# Patient Record
Sex: Male | Born: 1980 | Race: White | Hispanic: No | Smoking: Never smoker
Health system: Southern US, Community
[De-identification: ages and names within clinical notes are randomized; demographics above are authoritative.]

## PROBLEM LIST (undated history)

## (undated) DIAGNOSIS — M109 Gout, unspecified: Secondary | ICD-10-CM

---

## 2021-07-03 ENCOUNTER — Other Ambulatory Visit: Payer: Self-pay

## 2021-07-03 ENCOUNTER — Emergency Department (HOSPITAL_BASED_OUTPATIENT_CLINIC_OR_DEPARTMENT_OTHER)
Admission: EM | Admit: 2021-07-03 | Discharge: 2021-07-03 | Disposition: A | Discharge status: Home / Self Care | Payer: 59 | Attending: Emergency Medicine | Admitting: Emergency Medicine

## 2021-07-03 ENCOUNTER — Emergency Department (HOSPITAL_BASED_OUTPATIENT_CLINIC_OR_DEPARTMENT_OTHER): Payer: 59

## 2021-07-03 ENCOUNTER — Encounter (HOSPITAL_BASED_OUTPATIENT_CLINIC_OR_DEPARTMENT_OTHER): Payer: Self-pay | Admitting: *Deleted

## 2021-07-03 DIAGNOSIS — K921 Melena: Secondary | ICD-10-CM

## 2021-07-03 DIAGNOSIS — R1084 Generalized abdominal pain: Secondary | ICD-10-CM | POA: Diagnosis present

## 2021-07-03 DIAGNOSIS — R197 Diarrhea, unspecified: Secondary | ICD-10-CM

## 2021-07-03 HISTORY — DX: Gout, unspecified: M10.9

## 2021-07-03 LAB — CBC WITH DIFFERENTIAL/PLATELET
Abs Immature Granulocytes: 0.03 10*3/uL (ref 0.00–0.07)
Basophils Absolute: 0 10*3/uL (ref 0.0–0.1)
Basophils Relative: 0 %
Eosinophils Absolute: 0 10*3/uL (ref 0.0–0.5)
Eosinophils Relative: 0 %
HCT: 43.3 % (ref 39.0–52.0)
Hemoglobin: 15 g/dL (ref 13.0–17.0)
Immature Granulocytes: 0 %
Lymphocytes Relative: 18 %
Lymphs Abs: 1.6 10*3/uL (ref 0.7–4.0)
MCH: 28.8 pg (ref 26.0–34.0)
MCHC: 34.6 g/dL (ref 30.0–36.0)
MCV: 83.3 fL (ref 80.0–100.0)
Monocytes Absolute: 0.4 10*3/uL (ref 0.1–1.0)
Monocytes Relative: 5 %
Neutro Abs: 6.6 10*3/uL (ref 1.7–7.7)
Neutrophils Relative %: 77 %
Platelets: 230 10*3/uL (ref 150–400)
RBC: 5.2 MIL/uL (ref 4.22–5.81)
RDW: 12.7 % (ref 11.5–15.5)
WBC: 8.7 10*3/uL (ref 4.0–10.5)
nRBC: 0 % (ref 0.0–0.2)

## 2021-07-03 LAB — COMPREHENSIVE METABOLIC PANEL
ALT: 61 U/L — ABNORMAL HIGH (ref 0–44)
AST: 41 U/L (ref 15–41)
Albumin: 4.5 g/dL (ref 3.5–5.0)
Alkaline Phosphatase: 57 U/L (ref 38–126)
Anion gap: 8 (ref 5–15)
BUN: 12 mg/dL (ref 6–20)
CO2: 23 mmol/L (ref 22–32)
Calcium: 9.1 mg/dL (ref 8.9–10.3)
Chloride: 104 mmol/L (ref 98–111)
Creatinine, Ser: 0.77 mg/dL (ref 0.61–1.24)
GFR, Estimated: >60 mL/min (ref >60)
Glucose, Bld: 103 mg/dL — ABNORMAL HIGH (ref 70–99)
Potassium: 4.1 mmol/L (ref 3.5–5.1)
Sodium: 135 mmol/L (ref 135–145)
Total Bilirubin: 1.7 mg/dL — ABNORMAL HIGH (ref 0.3–1.2)
Total Protein: 7.6 g/dL (ref 6.5–8.1)

## 2021-07-03 LAB — URINALYSIS, ROUTINE W REFLEX MICROSCOPIC
Bilirubin Urine: NEGATIVE
Glucose, UA: NEGATIVE mg/dL
Hgb urine dipstick: NEGATIVE
Ketones, ur: NEGATIVE mg/dL
Leukocytes,Ua: NEGATIVE
Nitrite: NEGATIVE
Protein, ur: NEGATIVE mg/dL
Specific Gravity, Urine: 1.015 (ref 1.005–1.030)
pH: 7 (ref 5.0–8.0)

## 2021-07-03 LAB — LIPASE, BLOOD: Lipase: 30 U/L (ref 11–51)

## 2021-07-03 LAB — OCCULT BLOOD X 1 CARD TO LAB, STOOL: Fecal Occult Bld: NEGATIVE

## 2021-07-03 MED ORDER — DICYCLOMINE HCL 20 MG PO TABS: 20.0000 mg | ORAL_TABLET | Freq: Two times a day (BID) | ORAL | 0 refills | Status: AC

## 2021-07-03 MED ORDER — IOHEXOL 300 MG/ML  SOLN
100.0000 mL | Freq: Once | INTRAMUSCULAR | Status: AC | PRN
  Administered 2021-07-03: 100 mL via INTRAVENOUS

## 2021-07-03 NOTE — ED Notes (Signed)
Skin warm and dry, capillary refill WNL, strong pulses noted ?

## 2021-07-03 NOTE — ED Notes (Signed)
Pt aware of need for urine specimen, unable to provide at this time, provided urinal. ?

## 2021-07-03 NOTE — Discharge Instructions (Addendum)
You were seen today for concerns of a bloody diarrhea.  The CT scan showed no acute findings.  There were no signs of infection in your lab work.  Recommend follow-up with primary care if you continue to have symptoms.  I have provided a prescription to help with spasms. If stool sample collected, you will be contacted about antibiotics if your stool sample provides a positive result. ?

## 2021-07-03 NOTE — ED Provider Notes (Signed)
?[facility] EMERGENCY DEPARTMENT ?Provider Note ? ? ?CSN: [id number] ?Arrival date & time: 07/03/21  0911 ? ?  ? ?History ? ?Chief Complaint  ?Patient presents with  ? Abdominal Pain  ? ? ?James Mckay is a 41 y.o. male.  Patient presents with diffuse abdominal pain, diarrhea, and blood in stool that began at 1 AM this morning.  Patient states he woke up with cramping and went to the bathroom having 1 solid stool followed by multiple episodes of diarrhea.  The patient had bright red blood in the toilet after multiple bowel movements.  Patient denies recent travel, denies eating any high risk foods.  Patient denies nausea, denies vomiting, denies shortness of breath, denies chest pain.  Patient also denies known fevers.  Past medical history significant for gout, obesity, patient started [facility] on June 18, 2021 ? ?HPI ? ?  ? ?Home Medications ?Prior to Admission medications   ?Medication Sig Start Date End Date Taking? Authorizing Provider  ?allopurinol (ZYLOPRIM) 100 MG tablet Take 100 mg by mouth daily.   Yes [provider]  ?dicyclomine (BENTYL) 20 MG tablet Take 1 tablet (20 mg total) by mouth 2 (two) times daily. 07/03/21  Yes Darrick Grinder, PA-C  ?   ? ?Allergies    ?Sulfa antibiotics   ? ?Review of Systems   ?Review of Systems  ?Constitutional:  Negative for fever.  ?Respiratory:  Negative for shortness of breath.   ?Cardiovascular:  Negative for chest pain.  ?Gastrointestinal:  Positive for abdominal pain, blood in stool and diarrhea.  ?Genitourinary:  Negative for dysuria and flank pain.  ? ?Physical Exam ?Updated Vital Signs ?BP 138/90   Pulse 65   Temp 98 ?F (36.7 ?C) (Oral)   Resp 18   Ht 6' (1.829 m)   Wt 124.7 kg   SpO2 98%   BMI 37.30 kg/m?  ?Physical Exam ?Vitals and nursing note reviewed.  ?Constitutional:   ?   General: He is not in acute distress. ?   Appearance: He is obese.  ?HENT:  ?   Head: Normocephalic and atraumatic.  ?Cardiovascular:  ?   Rate and Rhythm:  Normal rate and regular rhythm.  ?   Heart sounds: Normal heart sounds.  ?Pulmonary:  ?   Effort: Pulmonary effort is normal.  ?   Breath sounds: Normal breath sounds.  ?Abdominal:  ?   General: Abdomen is flat. Bowel sounds are normal.  ?   Palpations: Abdomen is soft.  ?   Tenderness: There is generalized abdominal tenderness (Mild generalized tenderness).  ?Skin: ?   General: Skin is warm and dry.  ? ? ?ED Results / Procedures / Treatments   ?Labs ?(all labs ordered are listed, but only abnormal results are displayed) ?Labs Reviewed  ?COMPREHENSIVE METABOLIC PANEL - Abnormal; Notable for the following components:  ?    Result Value  ? Glucose, Bld 103 (*)   ? ALT 61 (*)   ? Total Bilirubin 1.7 (*)   ? All other components within normal limits  ?GASTROINTESTINAL PANEL BY PCR, STOOL (REPLACES STOOL CULTURE)  ?CBC WITH DIFFERENTIAL/PLATELET  ?LIPASE, BLOOD  ?URINALYSIS, ROUTINE W REFLEX MICROSCOPIC  ?OCCULT BLOOD X 1 CARD TO LAB, STOOL  ? ? ?EKG ?None ? ?Radiology ?CT Abdomen Pelvis W Contrast ? ?Result Date: 07/03/2021 ?CLINICAL DATA:  Acute nonlocalized abdominal pain. Cramping, diarrhea and nausea that started last night EXAM: CT ABDOMEN AND PELVIS WITH CONTRAST TECHNIQUE: Multidetector CT imaging of the abdomen and pelvis  was performed using the standard protocol following bolus administration of intravenous contrast. RADIATION DOSE REDUCTION: This exam was performed according to the departmental dose-optimization program which includes automated exposure control, adjustment of the mA and/or kV according to patient size and/or use of iterative reconstruction technique. CONTRAST:  OMNIPAQUE IOHEXOL 300 MG/ML  SOLN COMPARISON:  None Available. FINDINGS: Lower chest: No acute abnormality. Hepatobiliary: Diffuse hepatic steatosis. Gallbladder is unremarkable. No biliary ductal dilation. Pancreas: No pancreatic ductal dilation or evidence of acute inflammation. Spleen: No splenomegaly or focal splenic lesion.  Adrenals/Urinary Tract: Bilateral adrenal glands appear normal. No hydronephrosis. Kidneys demonstrate symmetric enhancement and excretion of contrast material. Urinary bladder is minimally distended limiting evaluation. Stomach/Bowel: Stomach is minimally distended without abnormal wall thickening identified. No pathologic dilation small or large bowel. Terminal ileum and appendix appear normal. Colon is predominantly decompressed. No evidence of acute bowel inflammation. Vascular/Lymphatic: Normal caliber abdominal aorta. No pathologically enlarged abdominal or pelvic lymph nodes. Reproductive: Prostate gland and seminal vesicles appear normal. Other: No significant abdominopelvic free fluid. Musculoskeletal: No acute or significant osseous findings. IMPRESSION: 1. No acute abdominopelvic findings. 2. Diffuse hepatic steatosis. Electronically Signed   By: Maudry Mayhew M.D.   On: 07/03/2021 11:05   ? ?Procedures ?Procedures  ? ? ?Medications Ordered in ED ?Medications  ?iohexol (OMNIPAQUE) 300 MG/ML solution 100 mL (100 mLs Intravenous Contrast Given 07/03/21 1042)  ? ? ?ED Course/ Medical Decision Making/ A&P ?  ?                        ?Medical Decision Making ?Amount and/or Complexity of Data Reviewed ?Labs: ordered. ?Radiology: ordered. ? ? ?This patient presents to the ED for concern of bloody diarrhea, this involves an extensive number of treatment options, and is a complaint that carries with it a high risk of complications and morbidity.  The differential diagnosis includes infectious gastroenteritis, colitis, diverticulitis, hemorrhoids, and other ? ? ?Co morbidities that complicate the patient evaluation ? ?None ? ? ?Additional history obtained: ? ?Additional history obtained from patient's wife ?External records from outside source obtained and reviewed including office notes from primary care ? ? ?Lab Tests: ? ?I Ordered, and personally interpreted labs.  The pertinent results include: Urinalysis,  lipase, CBC grossly normal.  Negative fecal occult blood test.  CMP shows ALT of 61. ? ? ?Imaging Studies ordered: ? ?I ordered imaging studies including CT abdomen pelvis ?I independently visualized and interpreted imaging which showed no acute abdominopelvic findings.  Diffuse hepatic steatosis ?I agree with the radiologist interpretation ? ? ?Problem List / ED Course / Critical interventions / Medication management ? ? ?I have reviewed the patients home medicines and have made adjustments as needed ? ? ?Test / Admission - Considered: ? ?There were no acute findings on the CT scan.  No signs of acute bowel inflammation.  No signs of appendicitis.  No signs of diverticulitis.  Lipase normal, no sign of pancreatitis. ? ?Based on the patient's onset and bloody diarrhea, this is likely infectious.  We will attempt to get a stool sample while the patient is here in the emergency department.  He has not had a bowel movement since being in the department.  The patient's tenderness to palpation is mild and diffuse across the abdomen.  If the patient is unable to provide a sample, recommend follow-up with primary care.  No evidence to start empiric antibiotics without known pathogen.  Plan to discharge home hopefully after stool  sample obtained. Bentyl for symptoms ? ?Final Clinical Impression(s) / ED Diagnoses ?Final diagnoses:  ?Bloody stool  ?Diarrhea, unspecified type  ? ? ?Rx / DC Orders ?ED Discharge Orders   ? ?      Ordered  ?  dicyclomine (BENTYL) 20 MG tablet  2 times daily       ? 07/03/21 1225  ? ?  ?  ? ?  ? ? ?  ?Darrick GrinderMcCauley, Julianny Milstein B, PA-C ?07/03/21 1240 ? ?  ?Terrilee FilesButler, Michael C, MD ?07/03/21 1734 ? ?

## 2021-07-03 NOTE — ED Notes (Signed)
Pt reports continuing cramping, pt to bathroom to attempt to provide stool specimen.  Provider aware of increased cramping sensation ?

## 2021-07-03 NOTE — ED Triage Notes (Signed)
Awoke last night with cramping severe sharp abd pain, had a lot of diarrhea, states there was some blood in the stool. Has had some nausea, diaphoretic at times.  ?

## 2021-07-04 LAB — GASTROINTESTINAL PANEL BY PCR, STOOL (REPLACES STOOL CULTURE)

## 2022-10-31 ENCOUNTER — Emergency Department (HOSPITAL_BASED_OUTPATIENT_CLINIC_OR_DEPARTMENT_OTHER)
Admission: EM | Admit: 2022-10-31 | Discharge: 2022-11-01 | Disposition: A | Discharge status: Home / Self Care | Payer: 59 | Source: Home / Self Care | Attending: Emergency Medicine | Admitting: Emergency Medicine

## 2022-10-31 ENCOUNTER — Emergency Department (HOSPITAL_BASED_OUTPATIENT_CLINIC_OR_DEPARTMENT_OTHER): Payer: 59

## 2022-10-31 ENCOUNTER — Other Ambulatory Visit: Payer: Self-pay

## 2022-10-31 ENCOUNTER — Encounter (HOSPITAL_BASED_OUTPATIENT_CLINIC_OR_DEPARTMENT_OTHER): Payer: Self-pay

## 2022-10-31 DIAGNOSIS — R7401 Elevation of levels of liver transaminase levels: Secondary | ICD-10-CM | POA: Diagnosis not present

## 2022-10-31 DIAGNOSIS — R1013 Epigastric pain: Secondary | ICD-10-CM | POA: Diagnosis not present

## 2022-10-31 LAB — CBC
HCT: 43.1 % (ref 39.0–52.0)
Hemoglobin: 14.4 g/dL (ref 13.0–17.0)
MCH: 27.9 pg (ref 26.0–34.0)
MCHC: 33.4 g/dL (ref 30.0–36.0)
MCV: 83.4 fL (ref 80.0–100.0)
Platelets: 243 10*3/uL (ref 150–400)
RBC: 5.17 MIL/uL (ref 4.22–5.81)
RDW: 13.2 % (ref 11.5–15.5)
WBC: 7.7 10*3/uL (ref 4.0–10.5)
nRBC: 0 % (ref 0.0–0.2)

## 2022-10-31 LAB — BASIC METABOLIC PANEL
Anion gap: 12 (ref 5–15)
BUN: 12 mg/dL (ref 6–20)
CO2: 28 mmol/L (ref 22–32)
Calcium: 9.3 mg/dL (ref 8.9–10.3)
Chloride: 102 mmol/L (ref 98–111)
Creatinine, Ser: 0.98 mg/dL (ref 0.61–1.24)
GFR, Estimated: >60 mL/min (ref >60)
Glucose, Bld: 101 mg/dL — ABNORMAL HIGH (ref 70–99)
Potassium: 4.2 mmol/L (ref 3.5–5.1)
Sodium: 142 mmol/L (ref 135–145)

## 2022-10-31 LAB — TROPONIN I (HIGH SENSITIVITY): Troponin I (High Sensitivity): 3 ng/L (ref <18)

## 2022-10-31 MED ORDER — SODIUM CHLORIDE 0.9 % IV BOLUS
1000.0000 mL | Freq: Once | INTRAVENOUS | Status: AC
  Administered 2022-11-01: 1000 mL via INTRAVENOUS

## 2022-10-31 MED ORDER — FENTANYL CITRATE PF 50 MCG/ML IJ SOSY
50.0000 ug | PREFILLED_SYRINGE | Freq: Once | INTRAMUSCULAR | Status: AC
  Administered 2022-11-01: 50 ug via INTRAVENOUS
  Filled 2022-10-31: qty 1

## 2022-10-31 MED ORDER — ONDANSETRON HCL 4 MG/2ML IJ SOLN
4.0000 mg | Freq: Once | INTRAMUSCULAR | Status: AC
  Administered 2022-11-01: 4 mg via INTRAVENOUS
  Filled 2022-10-31: qty 2

## 2022-10-31 NOTE — ED Provider Notes (Signed)
[place] [facility]GENCY DEPARTMENT AT [facility]  Provider Note  CSN: [id number] Arrival date & time: 10/31/22 2313  History Chief Complaint  Patient presents with   Abdominal Pain    James Mckay is a 42 y.o. male with no significant PMH reports sudden onset of severe epigastric pain (not really chest pain as initially thought) radiating into his back after he went to bed tonight. No nausea or vomiting. Not improved with pepto. He had a similar episode about 2 weeks ago, not as severe and resolved on it's own. He does not have any history of HTN, no CAD. No prior issues with GERD, HLD, pancreatitis or biliary disease. He did have two beers with dinner tonight.    Home Medications Prior to Admission medications   Medication Sig Start Date End Date Taking? Authorizing Provider  allopurinol (ZYLOPRIM) 100 MG tablet Take 100 mg by mouth daily.    [provider]  dicyclomine (BENTYL) 20 MG tablet Take 1 tablet (20 mg total) by mouth 2 (two) times daily. 07/03/21   Darrick Grinder, PA-C     Allergies    Sulfa antibiotics   Review of Systems   Review of Systems Please see HPI for pertinent positives and negatives  Physical Exam BP (!) 131/94   Pulse 70   Temp 97.6 F (36.4 C) (Oral)   Resp 16   Ht 6' (1.829 m)   Wt 111.1 kg   SpO2 100%   BMI 33.23 kg/m   Physical Exam Vitals and nursing note reviewed.  Constitutional:      Appearance: Normal appearance.  HENT:     Head: Normocephalic and atraumatic.     Nose: Nose normal.     Mouth/Throat:     Mouth: Mucous membranes are moist.  Eyes:     Extraocular Movements: Extraocular movements intact.     Conjunctiva/sclera: Conjunctivae normal.  Cardiovascular:     Rate and Rhythm: Normal rate.     Pulses: Normal pulses.  Pulmonary:     Effort: Pulmonary effort is normal.     Breath sounds: Normal breath sounds.  Chest:     Chest wall: No tenderness.  Abdominal:     General: Abdomen is flat.      Palpations: Abdomen is soft.     Tenderness: There is abdominal tenderness (mild, epigastric). There is no guarding.  Musculoskeletal:        General: No swelling. Normal range of motion.     Cervical back: Neck supple.  Skin:    General: Skin is warm and dry.  Neurological:     General: No focal deficit present.     Mental Status: He is alert.  Psychiatric:        Mood and Affect: Mood normal.     ED Results / Procedures / Treatments   EKG EKG Interpretation Date/Time:  Thursday October 31 2022 23:23:21 EDT Ventricular Rate:  70 PR Interval:  178 QRS Duration:  107 QT Interval:  372 QTC Calculation: 402 R Axis:   -67  Text Interpretation: Sinus rhythm LAD, consider left anterior fascicular block Low voltage, precordial leads Baseline wander in lead(s) V1 V2 No significant change was found Confirmed by Susy Frizzle [phone]) on 10/31/2022 11:27:34 PM  Procedures Procedures  Medications Ordered in the ED Medications  fentaNYL (SUBLIMAZE) injection 50 mcg (50 mcg Intravenous Given 11/01/22 0019)  ondansetron (ZOFRAN) injection 4 mg (4 mg Intravenous Given 11/01/22 0018)  sodium chloride 0.9 % bolus 1,000  mL (0 mLs Intravenous Stopped 11/01/22 0144)  iohexol (OMNIPAQUE) 350 MG/ML injection 100 mL (100 mLs Intravenous Contrast Given 11/01/22 0030)    Initial Impression and Plan  Patient here with severe epigastric pain, radiating into his back. Vitals are reassuring, not hypertensive. No peritoneal signs on exam. Consider ACS, dissection, GERD, pancreatitis, biliary colic. Will check labs and CTA. Pain/nausea meds for comfort.   ED Course   Clinical Course as of 11/01/22 0213  Thu Oct 31, 2022  2355 CBC is normal. BMP is normal, LFTs and Lipase added. Trop is neg.  [CS]  Fri Nov 01, 2022  0004 I personally viewed the images from radiology studies and agree with radiologist interpretation:  CXR is clear [CS]  0019 On LFTs, AST/ALT mildly elevated, lipase is normal.  [CS]   0121 I personally viewed the images from radiology studies and agree with radiologist interpretation: CTA is neg for dissection or other acute process. [CS]  0212 Repeat Trop remains negative. Patient reports pain has improved. No clear etiology for his pain, but does not appear to be an acute life threatening or surgical process. Patient advised to eat a bland diet the next few days, PCP follow up, RTED for any other concerns.   [CS]    Clinical Course User Index [CS] Pollyann Savoy, MD     MDM Rules/Calculators/A&P Medical Decision Making Given presenting complaint, I considered that admission might be necessary. After review of results from ED lab and/or imaging studies, admission to the hospital is not indicated at this time.    Problems Addressed: Epigastric pain: acute illness or injury  Amount and/or Complexity of Data Reviewed Labs: ordered. Decision-making details documented in ED Course. Radiology: ordered and independent interpretation performed. Decision-making details documented in ED Course. ECG/medicine tests: ordered and independent interpretation performed. Decision-making details documented in ED Course.  Risk Prescription drug management. Parenteral controlled substances. Decision regarding hospitalization.     Final Clinical Impression(s) / ED Diagnoses Final diagnoses:  Epigastric pain    Rx / DC Orders ED Discharge Orders     None        Pollyann Savoy, MD 11/01/22 [phone]

## 2022-10-31 NOTE — ED Triage Notes (Signed)
Pt states he was awakened by severe chest pain that radiates thru to his back. No radiation to arms or neck, no diaphoresis Denies n/v

## 2022-10-31 NOTE — ED Notes (Signed)
Patient transported to X-ray 

## 2022-11-01 ENCOUNTER — Emergency Department (HOSPITAL_BASED_OUTPATIENT_CLINIC_OR_DEPARTMENT_OTHER): Payer: 59

## 2022-11-01 LAB — HEPATIC FUNCTION PANEL
ALT: 60 U/L — ABNORMAL HIGH (ref 0–44)
AST: 76 U/L — ABNORMAL HIGH (ref 15–41)
Albumin: 4.3 g/dL (ref 3.5–5.0)
Alkaline Phosphatase: 62 U/L (ref 38–126)
Bilirubin, Direct: 0.2 mg/dL (ref 0.0–0.2)
Indirect Bilirubin: 0.8 mg/dL (ref 0.3–0.9)
Total Bilirubin: 1 mg/dL (ref 0.3–1.2)
Total Protein: 7.1 g/dL (ref 6.5–8.1)

## 2022-11-01 LAB — LIPASE, BLOOD: Lipase: 41 U/L (ref 11–51)

## 2022-11-01 LAB — TROPONIN I (HIGH SENSITIVITY): Troponin I (High Sensitivity): 3 ng/L (ref <18)

## 2022-11-01 MED ORDER — IOHEXOL 350 MG/ML SOLN
100.0000 mL | Freq: Once | INTRAVENOUS | Status: AC | PRN
  Administered 2022-11-01: 100 mL via INTRAVENOUS

## 2022-11-01 NOTE — ED Notes (Signed)
Patient transported to CT 

## 2023-04-14 ENCOUNTER — Emergency Department (HOSPITAL_BASED_OUTPATIENT_CLINIC_OR_DEPARTMENT_OTHER): Payer: 59

## 2023-04-14 ENCOUNTER — Encounter (HOSPITAL_BASED_OUTPATIENT_CLINIC_OR_DEPARTMENT_OTHER): Payer: Self-pay

## 2023-04-14 ENCOUNTER — Emergency Department (HOSPITAL_BASED_OUTPATIENT_CLINIC_OR_DEPARTMENT_OTHER)
Admission: EM | Admit: 2023-04-14 | Discharge: 2023-04-14 | Disposition: A | Discharge status: Home / Self Care | Payer: 59 | Attending: Emergency Medicine | Admitting: Emergency Medicine

## 2023-04-14 ENCOUNTER — Other Ambulatory Visit: Payer: Self-pay

## 2023-04-14 DIAGNOSIS — R110 Nausea: Secondary | ICD-10-CM | POA: Diagnosis not present

## 2023-04-14 DIAGNOSIS — R1011 Right upper quadrant pain: Secondary | ICD-10-CM | POA: Diagnosis present

## 2023-04-14 DIAGNOSIS — R1084 Generalized abdominal pain: Secondary | ICD-10-CM | POA: Insufficient documentation

## 2023-04-14 LAB — COMPREHENSIVE METABOLIC PANEL
ALT: 422 U/L — ABNORMAL HIGH (ref 0–44)
AST: 533 U/L — ABNORMAL HIGH (ref 15–41)
Albumin: 4.3 g/dL (ref 3.5–5.0)
Alkaline Phosphatase: 96 U/L (ref 38–126)
Anion gap: 8 (ref 5–15)
BUN: 10 mg/dL (ref 6–20)
CO2: 27 mmol/L (ref 22–32)
Calcium: 9.3 mg/dL (ref 8.9–10.3)
Chloride: 105 mmol/L (ref 98–111)
Creatinine, Ser: 0.9 mg/dL (ref 0.61–1.24)
GFR, Estimated: >60 mL/min (ref >60)
Glucose, Bld: 98 mg/dL (ref 70–99)
Potassium: 3.8 mmol/L (ref 3.5–5.1)
Sodium: 140 mmol/L (ref 135–145)
Total Bilirubin: 3.1 mg/dL — ABNORMAL HIGH (ref 0.0–1.2)
Total Protein: 7 g/dL (ref 6.5–8.1)

## 2023-04-14 LAB — CBC WITH DIFFERENTIAL/PLATELET
Abs Immature Granulocytes: 0.01 10*3/uL (ref 0.00–0.07)
Basophils Absolute: 0 10*3/uL (ref 0.0–0.1)
Basophils Relative: 0 %
Eosinophils Absolute: 0.1 10*3/uL (ref 0.0–0.5)
Eosinophils Relative: 1 %
HCT: 42.3 % (ref 39.0–52.0)
Hemoglobin: 14.5 g/dL (ref 13.0–17.0)
Immature Granulocytes: 0 %
Lymphocytes Relative: 27 %
Lymphs Abs: 1.3 10*3/uL (ref 0.7–4.0)
MCH: 28.4 pg (ref 26.0–34.0)
MCHC: 34.3 g/dL (ref 30.0–36.0)
MCV: 82.8 fL (ref 80.0–100.0)
Monocytes Absolute: 0.4 10*3/uL (ref 0.1–1.0)
Monocytes Relative: 9 %
Neutro Abs: 3.1 10*3/uL (ref 1.7–7.7)
Neutrophils Relative %: 63 %
Platelets: 204 10*3/uL (ref 150–400)
RBC: 5.11 MIL/uL (ref 4.22–5.81)
RDW: 12.9 % (ref 11.5–15.5)
WBC: 4.8 10*3/uL (ref 4.0–10.5)
nRBC: 0 % (ref 0.0–0.2)

## 2023-04-14 LAB — LIPASE, BLOOD: Lipase: 40 U/L (ref 11–51)

## 2023-04-14 LAB — HEPATITIS PANEL, ACUTE
HCV Ab: NONREACTIVE
Hep A IgM: NONREACTIVE
Hep B C IgM: NONREACTIVE
Hepatitis B Surface Ag: NONREACTIVE

## 2023-04-14 MED ORDER — ONDANSETRON HCL 4 MG/2ML IJ SOLN
4.0000 mg | Freq: Once | INTRAMUSCULAR | Status: AC
  Administered 2023-04-14: 4 mg via INTRAVENOUS
  Filled 2023-04-14: qty 2

## 2023-04-14 MED ORDER — MORPHINE SULFATE (PF) 4 MG/ML IV SOLN
4.0000 mg | Freq: Once | INTRAVENOUS | Status: AC
  Administered 2023-04-14: 4 mg via INTRAVENOUS
  Filled 2023-04-14: qty 1

## 2023-04-14 MED ORDER — ALUM & MAG HYDROXIDE-SIMETH 200-200-20 MG/5ML PO SUSP
30.0000 mL | Freq: Once | ORAL | Status: AC
  Administered 2023-04-14: 30 mL via ORAL
  Filled 2023-04-14: qty 30

## 2023-04-14 MED ORDER — IOHEXOL 300 MG/ML  SOLN
100.0000 mL | Freq: Once | INTRAMUSCULAR | Status: AC | PRN
  Administered 2023-04-14: 100 mL via INTRAVENOUS

## 2023-04-14 MED ORDER — SODIUM CHLORIDE 0.9 % IV BOLUS
1000.0000 mL | Freq: Once | INTRAVENOUS | Status: AC
  Administered 2023-04-14: 1000 mL via INTRAVENOUS

## 2023-04-14 NOTE — Discharge Instructions (Signed)
Try pepcid or tagamet up to twice a day.  Try to avoid things that may make this worse, most commonly these are spicy foods tomato based products fatty foods chocolate and peppermint.  Alcohol and tobacco can also make this worse.  Return to the emergency department for sudden worsening pain fever or inability to eat or drink.  

## 2023-04-14 NOTE — ED Notes (Signed)
 Patient transported to CT

## 2023-04-14 NOTE — ED Triage Notes (Signed)
 C/o abdominal pain worsening since last night, nausea, lower back pain. Denies diarrhea.

## 2023-04-14 NOTE — ED Provider Notes (Signed)
 James Mckay Provider Note   CSN: [id number] Arrival date & time: 04/14/23  0756     History  Chief Complaint  Patient presents with   Abdominal Pain    James Mckay is a 43 y.o. male.  43 yo M with a chief complaints of right upper quadrant abdominal pain that radiates to the back.  This been going on since last night.  He said he was watching the super ball and then suddenly felt unwell.  Had a lot of trouble sleeping last night.  Had a couple episodes of retching but denies any vomiting.  No fevers.  Nothing seems to make it better or worse.  He had a similar event about a month or so ago.   Abdominal Pain      Home Medications Prior to Admission medications   Medication Sig Start Date End Date Taking? Authorizing Provider  allopurinol (ZYLOPRIM) 100 MG tablet Take 100 mg by mouth daily.    [provider]  dicyclomine  (BENTYL ) 20 MG tablet Take 1 tablet (20 mg total) by mouth 2 (two) times daily. 07/03/21   James Guest, James Mckay      Allergies    Sulfa antibiotics    Review of Systems   Review of Systems  Gastrointestinal:  Positive for abdominal pain.    Physical Exam Updated Vital Signs BP 108/78   Pulse (!) 54   Temp 98 F (36.7 C) (Oral)   Resp 15   Ht 6' (1.829 m)   Wt 99.8 kg   SpO2 98%   BMI 29.84 kg/m  Physical Exam Vitals and nursing note reviewed.  Constitutional:      Appearance: He is well-developed.  HENT:     Head: Normocephalic and atraumatic.  Eyes:     Pupils: Pupils are equal, round, and reactive to light.  Neck:     Vascular: No JVD.  Cardiovascular:     Rate and Rhythm: Normal rate and regular rhythm.     Heart sounds: No murmur heard.    No friction rub. No gallop.  Pulmonary:     Effort: No respiratory distress.     Breath sounds: No wheezing.  Abdominal:     General: There is no distension.     Tenderness: There is no abdominal tenderness. There is no guarding or  rebound.     Comments: Benign abdominal exam, negative murphys sign  Musculoskeletal:        General: Normal range of motion.     Cervical back: Normal range of motion and neck supple.  Skin:    Coloration: Skin is not pale.     Findings: No rash.  Neurological:     Mental Status: He is alert and oriented to person, place, and time.  Psychiatric:        Behavior: Behavior normal.     ED Results / Procedures / Treatments   Labs (all labs ordered are listed, but only abnormal results are displayed) Labs Reviewed  COMPREHENSIVE METABOLIC PANEL - Abnormal; Notable for the following components:      Result Value   AST 533 (*)    ALT 422 (*)    Total Bilirubin 3.1 (*)    All other components within normal limits  CBC WITH DIFFERENTIAL/PLATELET  LIPASE, BLOOD  HEPATITIS PANEL, ACUTE    EKG EKG Interpretation Date/Time:  Monday April 14 2023 08:52:30 EST Ventricular Rate:  62 PR Interval:  172 QRS Duration:  110 QT Interval:  387 QTC Calculation: 393 R Axis:   -52  Text Interpretation: Sinus rhythm RSR' in V1 or V2, right VCD or RVH No significant change since last tracing Confirmed by James Mckay [phone]) on 04/14/2023 8:57:00 AM  Radiology CT ABDOMEN PELVIS W CONTRAST Result Date: 04/14/2023 CLINICAL DATA:  Abdominal pain.  Nausea. EXAM: CT ABDOMEN AND PELVIS WITH CONTRAST TECHNIQUE: Multidetector CT imaging of the abdomen and pelvis was performed using the standard protocol following bolus administration of intravenous contrast. RADIATION DOSE REDUCTION: This exam was performed according to the departmental dose-optimization program which includes automated exposure control, adjustment of the mA and/or kV according to patient size and/or use of iterative reconstruction technique. CONTRAST:  OMNIPAQUE  IOHEXOL  300 MG/ML  SOLN COMPARISON:  CTA chest, abdomen, and pelvis dated 11/01/2022. Right upper quadrant abdominal ultrasound dated April 14, 2023. FINDINGS: Lower  chest: No acute abnormality. Hepatobiliary: No focal liver abnormality is seen. The echogenic focus noted within the gallbladder on the same-day right upper quadrant abdominal ultrasound is not appreciated on this exam and may represent focal soft tissue. No gallstones, gallbladder wall thickening, or biliary dilatation. Pancreas: Unremarkable. No pancreatic ductal dilatation or surrounding inflammatory changes. Spleen: Normal in size without focal abnormality. Adrenals/Urinary Tract: Adrenal glands are unremarkable. Kidneys are normal, without renal calculi, suspicious focal lesion, or hydronephrosis. Bladder is unremarkable. Stomach/Bowel: Stomach is moderately distended with ingested contents. Appendix appears normal. No evidence of bowel wall thickening, distention, or inflammatory changes. Scattered sigmoid colonic diverticulosis without evidence of acute diverticulitis. Vascular/Lymphatic: No significant vascular findings are present. No enlarged abdominal or pelvic lymph nodes. Reproductive: Prostate is unremarkable. Other: No abdominal wall hernia or abnormality. No abdominopelvic ascites. No intraperitoneal free air. Musculoskeletal: No acute osseous abnormality. No suspicious osseous lesion. IMPRESSION: 1. No acute localizing findings in the abdomen or pelvis. 2. Scattered sigmoid colonic diverticulosis without evidence of acute diverticulitis. Electronically Signed   By: Mannie Seek M.D.   On: 04/14/2023 11:38   US  Abdomen Limited RUQ (LIVER/GB) Result Date: 04/14/2023 CLINICAL DATA:  Right upper quadrant abdominal pain and nausea. EXAM: ULTRASOUND ABDOMEN LIMITED RIGHT UPPER QUADRANT COMPARISON:  11/01/2022. FINDINGS: Gallbladder: A 10 mm echogenic focus within the gallbladder likely represents a stone. No gallbladder wall thickening or pericholecystic fluid visualized. No sonographic Murphy sign noted by sonographer. Common bile duct: Diameter: 2.0 mm Liver: No focal lesion identified. Mild  diffusely increased hepatic parenchymal echogenicity. Portal vein is patent on color Doppler imaging with normal direction of blood flow towards the liver. Other: None. IMPRESSION: 1. Cholelithiasis without sonographic evidence of acute cholecystitis. 2. Mild diffusely increased hepatic parenchymal echogenicity, which is nonspecific, but can be seen in the setting of hepatic steatosis or other hepatocellular disease processes. Correlate with liver function tests. Electronically Signed   By: Mannie Seek M.D.   On: 04/14/2023 09:24    Procedures Procedures    Medications Ordered in ED Medications  sodium chloride  0.9 % bolus 1,000 mL (1,000 mLs Intravenous New Bag/Given 04/14/23 0855)  morphine  (PF) 4 MG/ML injection 4 mg (4 mg Intravenous Given 04/14/23 0849)  ondansetron  (ZOFRAN ) injection 4 mg (4 mg Intravenous Given 04/14/23 0849)  alum & mag hydroxide-simeth (MAALOX/MYLANTA) 200-200-20 MG/5ML suspension 30 mL (30 mLs Oral Given 04/14/23 0853)  iohexol  (OMNIPAQUE ) 300 MG/ML solution 100 mL (100 mLs Intravenous Contrast Given 04/14/23 1012)    ED Course/ Medical Decision Making/ A&P  Medical Decision Making Amount and/or Complexity of Data Reviewed Labs: ordered. Radiology: ordered. ECG/medicine tests: ordered.  Risk OTC drugs. Prescription drug management.   43 yo M with a chief complaints of right upper quadrant abdominal pain that radiates to the back.  Going on last night and persisted into the morning.  He has a benign abdominal exam.  I suspect likely this is reflux.  Will obtain lab test to assess for pancreatitis or hepatitis.  Treat pain and nausea.  The patient is feeling a bit better on repeat assessments.  His right upper quadrant ultrasound shows cholelithiasis without obvious acute cholecystitis.  His liver function tests are quite a bit elevated, AST 530 ALT 420 alk phos is normal and total bilirubin is 3.1.  Lipase was normal.  No  leukocytosis.  Patient denies significant Tylenol intake.  Drinks occasionally.  I discussed case with Dr. Honey Lusty, [facility].  Recommended a CT scan of the abdomen pelvis to assess for possible pancreatic pathology.  This was negative and the patient was feeling better he felt reasonable for discharge home with outpatient follow-up.  If the patient had recurrence of his symptoms then medical admission to trend LFTs and GI will would see in the morning.  CT scan without obvious acute intra-abdominal pathology.  Patient continues to feel well.  Will discharge home.  GI follow-up.  12:03 PM:  I have discussed the diagnosis/risks/treatment options with the patient.  Evaluation and diagnostic testing in the emergency department does not suggest an emergent condition requiring admission or immediate intervention beyond what has been performed at this time.  They will follow up with PCP, GI. We also discussed returning to the ED immediately if new or worsening sx occur. We discussed the sx which are most concerning (e.g., sudden worsening pain, fever, inability to tolerate by mouth) that necessitate immediate return. Medications administered to the patient during their visit and any new prescriptions provided to the patient are listed below.  Medications given during this visit Medications  sodium chloride  0.9 % bolus 1,000 mL (1,000 mLs Intravenous New Bag/Given 04/14/23 0855)  morphine  (PF) 4 MG/ML injection 4 mg (4 mg Intravenous Given 04/14/23 0849)  ondansetron  (ZOFRAN ) injection 4 mg (4 mg Intravenous Given 04/14/23 0849)  alum & mag hydroxide-simeth (MAALOX/MYLANTA) 200-200-20 MG/5ML suspension 30 mL (30 mLs Oral Given 04/14/23 0853)  iohexol  (OMNIPAQUE ) 300 MG/ML solution 100 mL (100 mLs Intravenous Contrast Given 04/14/23 1012)     The patient appears reasonably screen and/or stabilized for discharge and I doubt any other medical condition or other [facility] requiring further screening, evaluation, or  treatment in the ED at this time prior to discharge.         Final Clinical Impression(s) / ED Diagnoses Final diagnoses:  Generalized abdominal pain    Rx / DC Orders ED Discharge Orders     None         James Hughs, DO 04/14/23 1203

## 2023-10-01 IMAGING — CT CT ABD-PELV W/ CM
2 of 5 series · 16 of 46 positions shown, 18 images · IV contrast (Omnipaque)
Comparison: None Available.

CLINICAL DATA: Acute nonlocalized abdominal pain. Cramping,
diarrhea and nausea that started last night

EXAM:
CT ABDOMEN AND PELVIS WITH CONTRAST
TECHNIQUE: Multidetector CT imaging of the abdomen and pelvis was performed
using the standard protocol following bolus administration of
intravenous contrast.

[Series 2: axial st · axial · 0.98mm/px · z∈[-549,-69]mm · 13 of 108 slices shown, 15 images]
[im 6/108  soft-tissue]
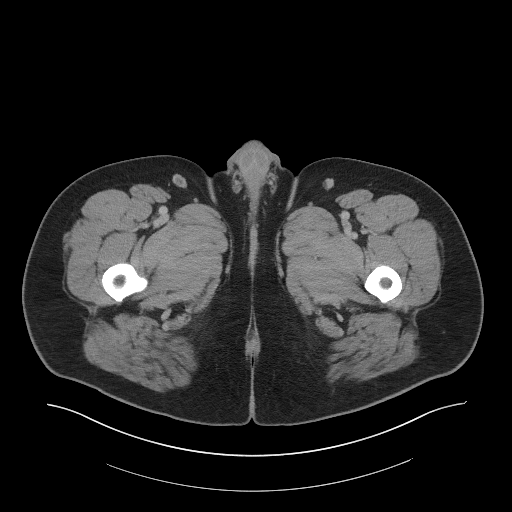
[im 6/108  bone]
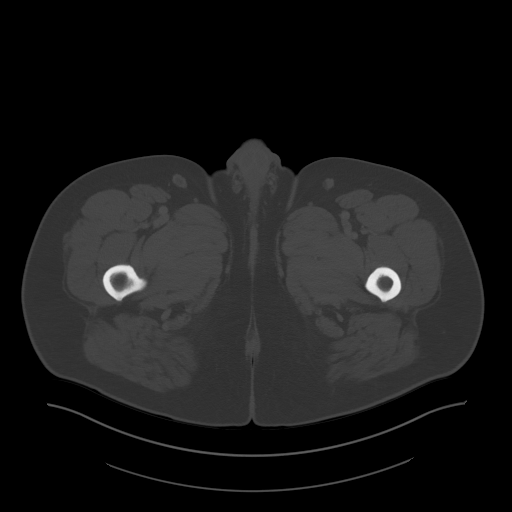
[im 17/108  soft-tissue]
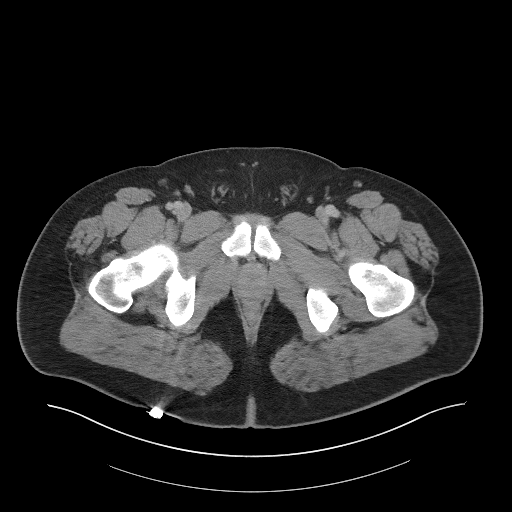
[im 23/108  soft-tissue]
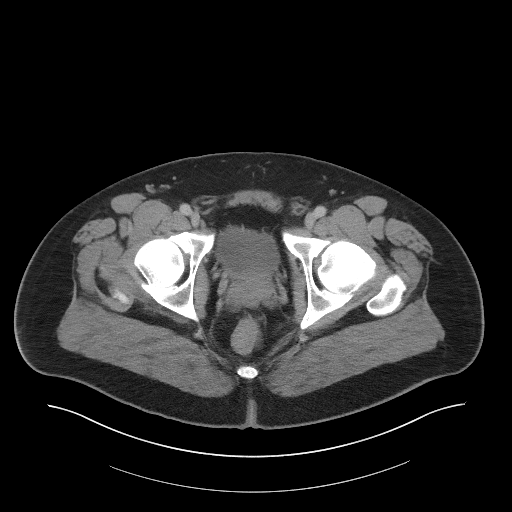
[im 29/108  soft-tissue]
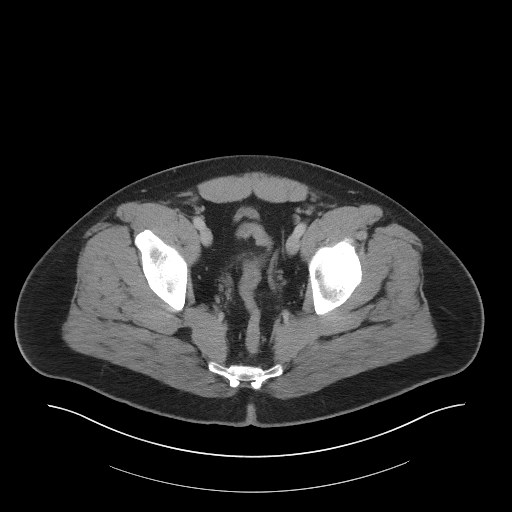
[im 40/108  soft-tissue]
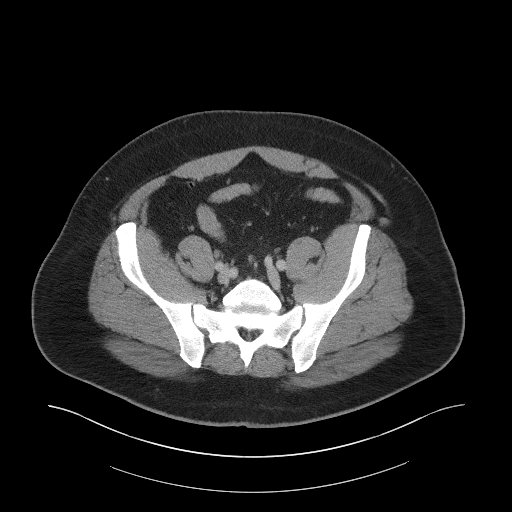
[im 46/108  soft-tissue]
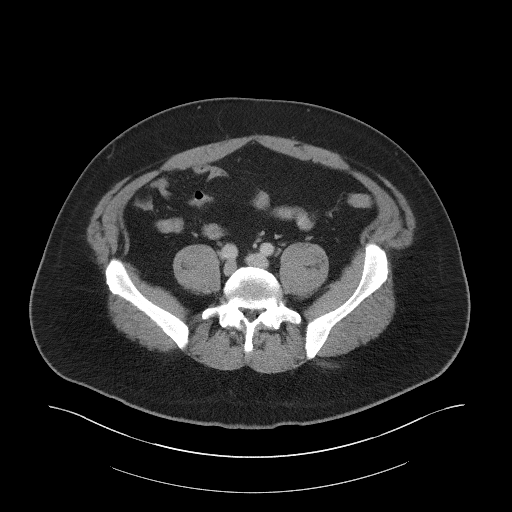
[im 57/108  soft-tissue]
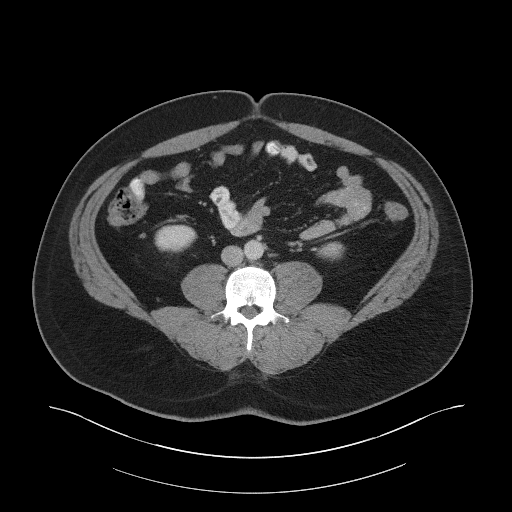
[im 62/108  soft-tissue]
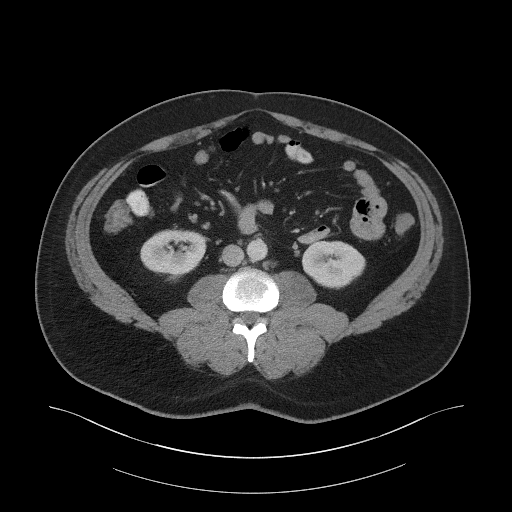
[im 68/108  soft-tissue]
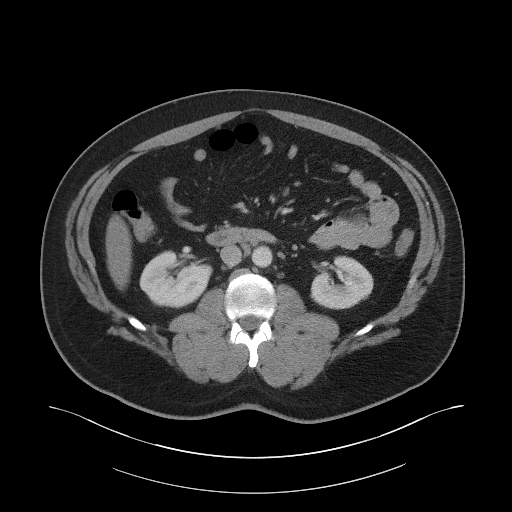
[im 68/108  bone]
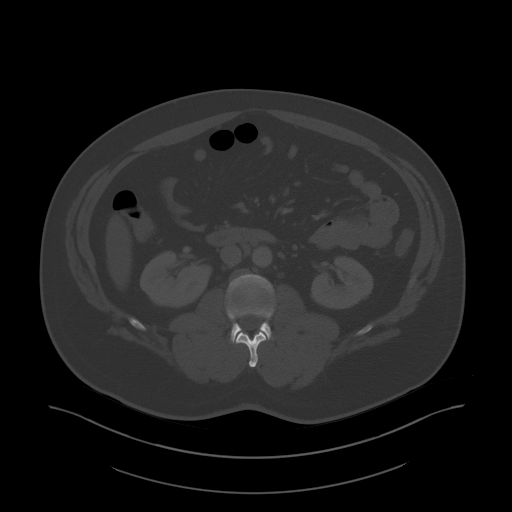
[im 79/108  soft-tissue]
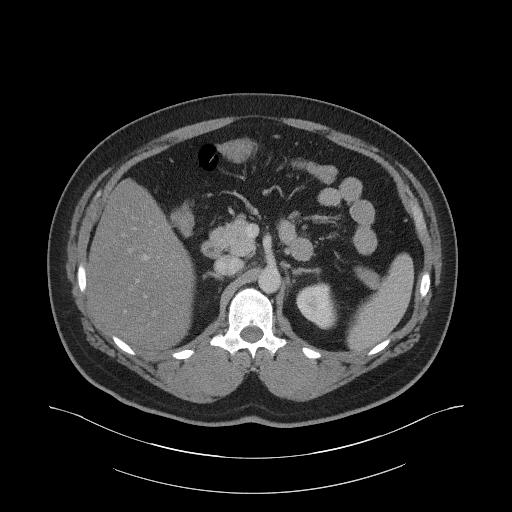
[im 85/108  soft-tissue]
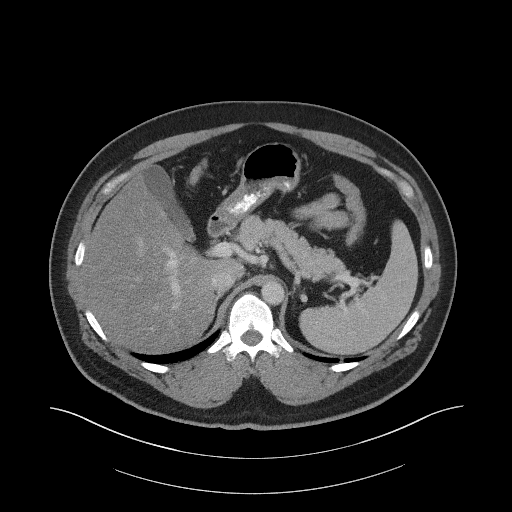
[im 91/108  soft-tissue]
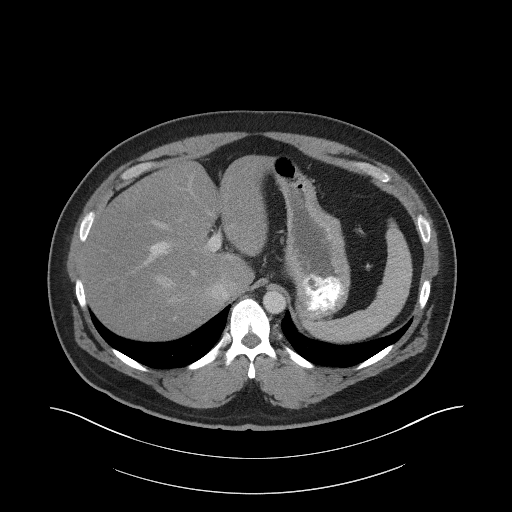
[im 102/108  soft-tissue]
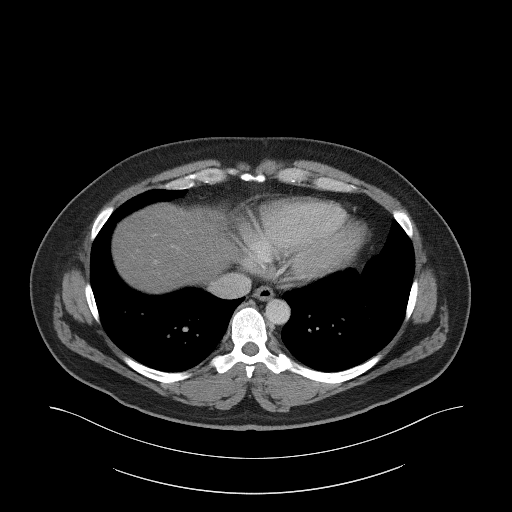

[Series 5: coronal st · coronal · 0.91mm/px · 3 of 111 slices shown]
[im 37/111  soft-tissue]
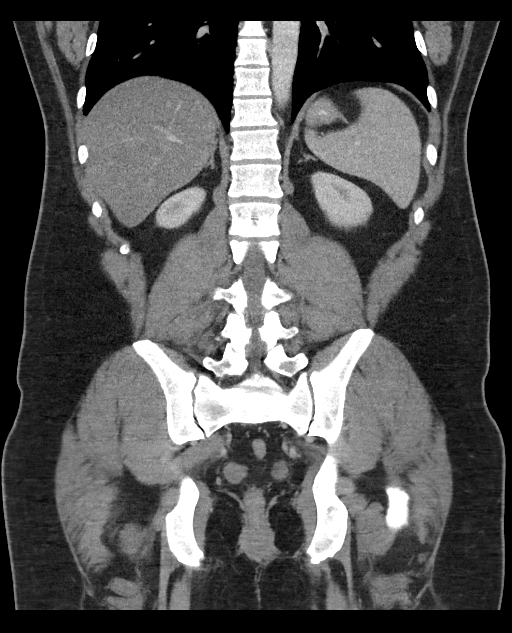
[im 49/111  soft-tissue]
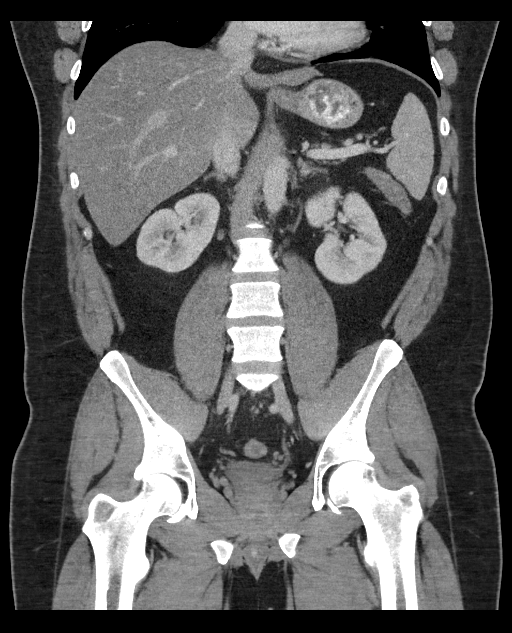
[im 62/111  soft-tissue]
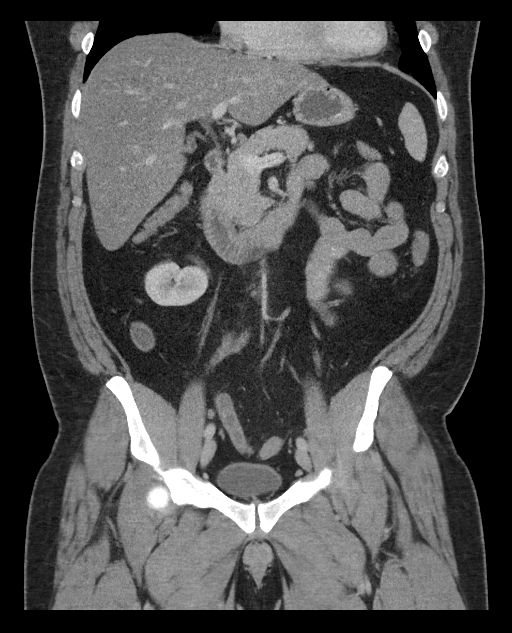

[16 of 46 positions shown; findings below may reference images not displayed]

RADIATION DOSE REDUCTION: This exam was performed according to the
departmental dose-optimization program which includes automated
exposure control, adjustment of the mA and/or kV according to
patient size and/or use of iterative reconstruction technique.

CONTRAST:  100mL OMNIPAQUE IOHEXOL 300 MG/ML  SOLN
FINDINGS: Lower chest: No acute abnormality.

Hepatobiliary: Diffuse hepatic steatosis. Gallbladder is
unremarkable. No biliary ductal dilation.

Pancreas: No pancreatic ductal dilation or evidence of acute
inflammation.

Spleen: No splenomegaly or focal splenic lesion.

Adrenals/Urinary Tract: Bilateral adrenal glands appear normal. No
hydronephrosis. Kidneys demonstrate symmetric enhancement and
excretion of contrast material. Urinary bladder is minimally
distended limiting evaluation.

Stomach/Bowel: Stomach is minimally distended without abnormal wall
thickening identified. No pathologic dilation small or large bowel.
Terminal ileum and appendix appear normal. Colon is predominantly
decompressed. No evidence of acute bowel inflammation.

Vascular/Lymphatic: Normal caliber abdominal aorta. No
pathologically enlarged abdominal or pelvic lymph nodes.

Reproductive: Prostate gland and seminal vesicles appear normal.

Other: No significant abdominopelvic free fluid.

Musculoskeletal: No acute or significant osseous findings.
IMPRESSION: 1. No acute abdominopelvic findings.
2. Diffuse hepatic steatosis.

## 2023-12-10 ENCOUNTER — Ambulatory Visit
Admission: EM | Admit: 2023-12-10 | Discharge: 2023-12-10 | Disposition: A | Discharge status: Home / Self Care | Attending: Family Medicine | Admitting: Family Medicine

## 2023-12-10 DIAGNOSIS — J069 Acute upper respiratory infection, unspecified: Secondary | ICD-10-CM | POA: Diagnosis not present

## 2023-12-10 DIAGNOSIS — R051 Acute cough: Secondary | ICD-10-CM | POA: Diagnosis not present

## 2023-12-10 LAB — POC SOFIA SARS ANTIGEN FIA: SARS Coronavirus 2 Ag: NEGATIVE

## 2023-12-10 NOTE — Discharge Instructions (Addendum)
 You have tested negative for COVID.  Please treat your symptoms with over the counter cough medication, tylenol  or ibuprofen , humidifier, and rest. Viral illnesses can last 7-14 days. Please follow up with your PCP if your symptoms are not improving. Please go to the ER for any worsening symptoms. This includes but is not limited to fever you can not control with tylenol  or ibuprofen , you are not able to stay hydrated, you have shortness of breath or chest pain.  Thank you for choosing [facility] for your healthcare needs. I hope you feel better soon!

## 2023-12-10 NOTE — ED Triage Notes (Signed)
 Pt present with c/o runny nose x Sunday. Pt states he woke up last night with a cough and nasal congestion. Pt woke up today with body aches and lower back pain. Pt has taken AM/PM multi symptom pills

## 2023-12-10 NOTE — ED Provider Notes (Signed)
 [facility]    CSN: [id number] Arrival date & time: 12/10/23  9070      History   Chief Complaint Chief Complaint  Patient presents with   Cough   Generalized Body Aches    HPI James Mckay is a 43 y.o. male  presents for evaluation of URI symptoms for 4 days. Patient reports associated symptoms of cough, congestion, body aches. Denies N/V/D, fevers, sore throat, ear pain, shortness of breath. Patient does not have a hx of asthma. Patient is not an active smoker.   Reports no known sick contacts.  Pt has taken cold medicine and vitamin C OTC for symptoms. Pt has no other concerns at this time.    Cough Associated symptoms: myalgias     Past Medical History:  Diagnosis Date   Gout     There are no active problems to display for this patient.   History reviewed. No pertinent surgical history.     Home Medications    Prior to Admission medications   Medication Sig Start Date End Date Taking? Authorizing Provider  allopurinol (ZYLOPRIM) 100 MG tablet Take 100 mg by mouth daily.    [provider]  dicyclomine  (BENTYL ) 20 MG tablet Take 1 tablet (20 mg total) by mouth 2 (two) times daily. 07/03/21   Logan Ubaldo NOVAK PA-C    Family History History reviewed. No pertinent family history.  Social History Social History   Tobacco Use   Smoking status: Never   Smokeless tobacco: Never  Vaping Use   Vaping status: Never Used  Substance Use Topics   Alcohol use: Yes    Comment: occ   Drug use: Never     Allergies   Sulfa antibiotics   Review of Systems Review of Systems  HENT:  Positive for congestion.   Respiratory:  Positive for cough.   Musculoskeletal:  Positive for myalgias.     Physical Exam Triage Vital Signs ED Triage Vitals [12/10/23 0944]  Encounter Vitals Group     BP 123/83     Girls Systolic BP Percentile      Girls Diastolic BP Percentile      Boys Systolic BP Percentile      Boys Diastolic BP Percentile       Pulse Rate 87     Resp 16     Temp 98.1 F (36.7 C)     Temp Source Oral     SpO2 95 %     Weight      Height      Head Circumference      Peak Flow      Pain Score 4     Pain Loc      Pain Education      Exclude from Growth Chart    No data found.  Updated Vital Signs BP 123/83 (BP Location: Right Arm)   Pulse 87   Temp 98.1 F (36.7 C) (Oral)   Resp 16   SpO2 95%   Visual Acuity Right Eye Distance:   Left Eye Distance:   Bilateral Distance:    Right Eye Near:   Left Eye Near:    Bilateral Near:     Physical Exam Vitals and nursing note reviewed.  Constitutional:      General: He is not in acute distress.    Appearance: Normal appearance. He is not ill-appearing or toxic-appearing.  HENT:     Head: Normocephalic and atraumatic.     Right Ear: Tympanic membrane  and ear canal normal.     Left Ear: Tympanic membrane and ear canal normal.     Nose: Congestion present.     Mouth/Throat:     Mouth: Mucous membranes are moist.     Pharynx: No oropharyngeal exudate or posterior oropharyngeal erythema.  Eyes:     Pupils: Pupils are equal, round, and reactive to light.  Cardiovascular:     Rate and Rhythm: Normal rate and regular rhythm.     Heart sounds: Normal heart sounds.  Pulmonary:     Effort: Pulmonary effort is normal.     Breath sounds: Normal breath sounds. No stridor. No wheezing, rhonchi or rales.  Musculoskeletal:     Cervical back: Normal range of motion and neck supple.  Lymphadenopathy:     Cervical: No cervical adenopathy.  Skin:    General: Skin is warm and dry.  Neurological:     General: No focal deficit present.     Mental Status: He is alert and oriented to person, place, and time.  Psychiatric:        Mood and Affect: Mood normal.        Behavior: Behavior normal.      UC Treatments / Results  Labs (all labs ordered are listed, but only abnormal results are displayed) Labs Reviewed  POC SOFIA SARS ANTIGEN FIA     EKG   Radiology No results found.  Procedures Procedures (including critical care time)  Medications Ordered in UC Medications - No data to display  Initial Impression / Assessment and Plan / UC Course  I have reviewed the triage vital signs and the nursing notes.  Pertinent labs & imaging results that were available during my care of the patient were reviewed by me and considered in my medical decision making (see chart for details).     Reviewed exam and symptoms with patient.  No red flags.  Negative COVID testing.  Discussed viral illness and symptomatic treatment.  Advise rest fluids and PCP follow-up symptoms do not improve.  ER precautions reviewed. Final Clinical Impressions(s) / UC Diagnoses   Final diagnoses:  Acute cough  Viral upper respiratory illness     Discharge Instructions      You have tested negative for COVID.  Please treat your symptoms with over the counter cough medication, tylenol or ibuprofen, humidifier, and rest. Viral illnesses can last 7-14 days. Please follow up with your PCP if your symptoms are not improving. Please go to the ER for any worsening symptoms. This includes but is not limited to fever you can not control with tylenol or ibuprofen, you are not able to stay hydrated, you have shortness of breath or chest pain.  Thank you for choosing [facility] for your healthcare needs. I hope you feel better soon!      ED Prescriptions   None    PDMP not reviewed this encounter.   Loreda Myla SAUNDERS, NP 12/10/23 1024

## 2023-12-12 ENCOUNTER — Encounter (HOSPITAL_BASED_OUTPATIENT_CLINIC_OR_DEPARTMENT_OTHER): Payer: Self-pay

## 2023-12-12 ENCOUNTER — Emergency Department (HOSPITAL_BASED_OUTPATIENT_CLINIC_OR_DEPARTMENT_OTHER)
Admission: EM | Admit: 2023-12-12 | Discharge: 2023-12-12 | Disposition: A | Discharge status: Home / Self Care | Attending: Emergency Medicine | Admitting: Emergency Medicine

## 2023-12-12 ENCOUNTER — Other Ambulatory Visit: Payer: Self-pay

## 2023-12-12 DIAGNOSIS — M545 Low back pain, unspecified: Secondary | ICD-10-CM | POA: Insufficient documentation

## 2023-12-12 MED ORDER — KETOROLAC TROMETHAMINE 15 MG/ML IJ SOLN
15.0000 mg | Freq: Once | INTRAMUSCULAR | Status: AC
  Administered 2023-12-12: 15 mg via INTRAMUSCULAR
  Filled 2023-12-12: qty 1

## 2023-12-12 MED ORDER — OXYCODONE HCL 5 MG PO TABS
5.0000 mg | ORAL_TABLET | Freq: Once | ORAL | Status: AC
  Administered 2023-12-12: 5 mg via ORAL
  Filled 2023-12-12: qty 1

## 2023-12-12 MED ORDER — ACETAMINOPHEN 500 MG PO TABS
1000.0000 mg | ORAL_TABLET | Freq: Once | ORAL | Status: AC
  Administered 2023-12-12: 1000 mg via ORAL
  Filled 2023-12-12: qty 2

## 2023-12-12 MED ORDER — DIAZEPAM 5 MG PO TABS
5.0000 mg | ORAL_TABLET | Freq: Once | ORAL | Status: AC
  Administered 2023-12-12: 5 mg via ORAL
  Filled 2023-12-12: qty 1

## 2023-12-12 NOTE — ED Provider Notes (Signed)
 [facility] Provider Note   CSN: [id number] Arrival date & time: 12/12/23  8197     Patient presents with: Back Pain   James Mckay is a 43 y.o. male.   43 yo M with a chief complaints of low back pain.  This is bilateral feels more like an ache.  It is worse at times he thinks sometimes is worse when he is in bed.  He denies urinary symptoms denies trauma.  Denies loss of bowel or bladder denies loss of peripheral sensation denies numbness or weakness to the legs.  He is just getting over an upper respiratory illness.  Was seen in urgent care a few days ago for the same.   Back Pain      Prior to Admission medications   Medication Sig Start Date End Date Taking? Authorizing Provider  allopurinol (ZYLOPRIM) 100 MG tablet Take 100 mg by mouth daily.    [provider]  dicyclomine  (BENTYL ) 20 MG tablet Take 1 tablet (20 mg total) by mouth 2 (two) times daily. 07/03/21   Logan Ubaldo NOVAK, PA-C    Allergies: Sulfa antibiotics    Review of Systems  Musculoskeletal:  Positive for back pain.    Updated Vital Signs BP 115/78   Pulse 88   Temp 98.1 F (36.7 C) (Oral)   Resp 16   Ht 6' (1.829 m)   Wt 93 kg   SpO2 98%   BMI 27.80 kg/m   Physical Exam Vitals and nursing note reviewed.  Constitutional:      Appearance: He is well-developed.  HENT:     Head: Normocephalic and atraumatic.  Eyes:     Pupils: Pupils are equal, round, and reactive to light.  Neck:     Vascular: No JVD.  Cardiovascular:     Rate and Rhythm: Normal rate and regular rhythm.     Heart sounds: No murmur heard.    No friction rub. No gallop.  Pulmonary:     Effort: No respiratory distress.     Breath sounds: No wheezing.  Abdominal:     General: There is no distension.     Tenderness: There is no abdominal tenderness. There is no guarding or rebound.  Musculoskeletal:        General: Normal range of motion.     Cervical back:  Normal range of motion and neck supple.     Comments: No obvious midline spinal tenderness step-offs or deformities.  Pulse motor and sensation intact in bilateral lower extremities.  Reflexes are 2+ and equal.  No clonus.  Skin:    Coloration: Skin is not pale.     Findings: No rash.  Neurological:     Mental Status: He is alert and oriented to person, place, and time.  Psychiatric:        Behavior: Behavior normal.     (all labs ordered are listed, but only abnormal results are displayed) Labs Reviewed - No data to display  EKG: None  Radiology: No results found.   Procedures   Medications Ordered in the ED  acetaminophen (TYLENOL) tablet 1,000 mg (1,000 mg Oral Given 12/12/23 2205)  ketorolac (TORADOL) 15 MG/ML injection 15 mg (15 mg Intramuscular Given 12/12/23 2205)  oxyCODONE (Oxy IR/ROXICODONE) immediate release tablet 5 mg (5 mg Oral Given 12/12/23 2205)  diazepam (VALIUM) tablet 5 mg (5 mg Oral Given 12/12/23 2205)  Medical Decision Making Risk OTC drugs. Prescription drug management.   43 yo M with a chief complaint of low back pain.  This has been going on for a couple days.  He was recently suffering from an upper respiratory illness.  Seen at urgent care.  He was worried about his back discomfort.  Has a trip planned to go to [place].  He wanted to make sure there is nothing wrong before he went.  By history and exam it seems most likely to be musculoskeletal back pain.  No red flags.  No trauma.  Perhaps exacerbated by his recent illness.  Will treat supportively.  PCP follow-up.  10:16 PM:  I have discussed the diagnosis/risks/treatment options with the patient and family.  Evaluation and diagnostic testing in the emergency department does not suggest an emergent condition requiring admission or immediate intervention beyond what has been performed at this time.  They will follow up with PCP. We also discussed  returning to the ED immediately if new or worsening sx occur. We discussed the sx which are most concerning (e.g., sudden worsening pain, fever, inability to tolerate by mouth) that necessitate immediate return. Medications administered to the patient during their visit and any new prescriptions provided to the patient are listed below.  Medications given during this visit Medications  acetaminophen (TYLENOL) tablet 1,000 mg (1,000 mg Oral Given 12/12/23 2205)  ketorolac (TORADOL) 15 MG/ML injection 15 mg (15 mg Intramuscular Given 12/12/23 2205)  oxyCODONE (Oxy IR/ROXICODONE) immediate release tablet 5 mg (5 mg Oral Given 12/12/23 2205)  diazepam (VALIUM) tablet 5 mg (5 mg Oral Given 12/12/23 2205)     The patient appears reasonably screen and/or stabilized for discharge and I doubt any other medical condition or other [facility] requiring further screening, evaluation, or treatment in the ED at this time prior to discharge.       Final diagnoses:  Acute bilateral low back pain without sciatica    ED Discharge Orders     None          Emil Share, DO 12/12/23 2216

## 2023-12-12 NOTE — ED Notes (Signed)
 Pt. Was seen by EDP and treated accordingly.

## 2023-12-12 NOTE — ED Triage Notes (Signed)
 Pt reports feeling bad starting last sunday. Went to UC on Wednesday. Ruled out covid/flu. Congestion/cough/runny nose/aches have improved but back pain remains. Pt states back pain is constant, does not worsen with movement. Pain primarily in center of lower back. NAD noted in triage. Denies any changes in urinary habits.

## 2023-12-12 NOTE — Discharge Instructions (Signed)
 Your back pain is most likely due to a muscular strain.  There is been a lot of research on back pain, unfortunately the only thing that seems to really help is Tylenol and ibuprofen.  Relative rest is also important to not lift greater than 10 pounds bending or twisting at the waist.  Please follow-up with your family physician.  The other thing that really seems to benefit patients is physical therapy which your doctor may send you for.  Please return to the emergency department for new numbness or weakness to your arms or legs. Difficulty with urinating or urinating or pooping on yourself.  Also if you cannot feel toilet paper when you wipe or get a fever.   Take 4 over the counter ibuprofen tablets 3 times a day or 2 over-the-counter naproxen tablets twice a day for pain. Also take tylenol 1000mg (2 extra strength) four times a day.   Stretches can also help try: [email or web address]
